# Patient Record
Sex: Male | Born: 1990 | Race: Black or African American | Hispanic: No | Marital: Single | Smoking: Former smoker
Health system: Southern US, Community
[De-identification: ages and names within clinical notes are randomized; demographics above are authoritative.]

## PROBLEM LIST (undated history)

## (undated) DIAGNOSIS — I10 Essential (primary) hypertension: Secondary | ICD-10-CM

## (undated) DIAGNOSIS — J45909 Unspecified asthma, uncomplicated: Secondary | ICD-10-CM

## (undated) HISTORY — PX: CARDIAC SURGERY: SHX584

---
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
## 2012-11-16 ENCOUNTER — Encounter (HOSPITAL_COMMUNITY): Payer: Self-pay | Admitting: Emergency Medicine

## 2012-11-16 ENCOUNTER — Emergency Department (HOSPITAL_COMMUNITY): Payer: BC Managed Care – PPO

## 2012-11-16 ENCOUNTER — Emergency Department (HOSPITAL_COMMUNITY)
Admission: EM | Admit: 2012-11-16 | Discharge: 2012-11-16 | Disposition: A | Discharge status: Home / Self Care | Payer: BC Managed Care – PPO | Attending: Emergency Medicine | Admitting: Emergency Medicine

## 2012-11-16 DIAGNOSIS — S1093XA Contusion of unspecified part of neck, initial encounter: Secondary | ICD-10-CM | POA: Insufficient documentation

## 2012-11-16 DIAGNOSIS — S0990XA Unspecified injury of head, initial encounter: Secondary | ICD-10-CM | POA: Insufficient documentation

## 2012-11-16 DIAGNOSIS — S0083XA Contusion of other part of head, initial encounter: Secondary | ICD-10-CM

## 2012-11-16 DIAGNOSIS — S0003XA Contusion of scalp, initial encounter: Secondary | ICD-10-CM | POA: Insufficient documentation

## 2012-11-16 DIAGNOSIS — Z23 Encounter for immunization: Secondary | ICD-10-CM | POA: Insufficient documentation

## 2012-11-16 MED ORDER — HYDROCODONE-ACETAMINOPHEN 5-325 MG PO TABS: 2.0000 | ORAL_TABLET | ORAL | Status: DC | PRN

## 2012-11-16 MED ORDER — NAPROXEN 500 MG PO TABS: 500.0000 mg | ORAL_TABLET | Freq: Two times a day (BID) | ORAL | Status: DC

## 2012-11-16 MED ORDER — OXYCODONE-ACETAMINOPHEN 5-325 MG PO TABS
2.0000 | ORAL_TABLET | Freq: Once | ORAL | Status: AC
  Administered 2012-11-16: 2 via ORAL
  Filled 2012-11-16: qty 2

## 2012-11-16 MED ORDER — TETANUS-DIPHTH-ACELL PERTUSSIS 5-2.5-18.5 LF-MCG/0.5 IM SUSP
0.5000 mL | Freq: Once | INTRAMUSCULAR | Status: AC
  Administered 2012-11-16: 0.5 mL via INTRAMUSCULAR
  Filled 2012-11-16: qty 0.5
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
## 2012-11-16 NOTE — ED Notes (Signed)
Pt transported to CT ?
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
## 2012-11-16 NOTE — ED Notes (Signed)
Pt has ride home.

## 2012-11-16 NOTE — ED Notes (Signed)
Family at bedside. 

## 2014-04-16 ENCOUNTER — Emergency Department (INDEPENDENT_AMBULATORY_CARE_PROVIDER_SITE_OTHER)
Admission: EM | Admit: 2014-04-16 | Discharge: 2014-04-16 | Disposition: A | Discharge status: Home / Self Care | Payer: Self-pay | Source: Home / Self Care | Attending: Family Medicine | Admitting: Family Medicine

## 2014-04-16 ENCOUNTER — Encounter (HOSPITAL_COMMUNITY): Payer: Self-pay | Admitting: Emergency Medicine

## 2014-04-16 ENCOUNTER — Other Ambulatory Visit (HOSPITAL_COMMUNITY)
Admission: RE | Admit: 2014-04-16 | Discharge: 2014-04-16 | Disposition: A | Discharge status: Home / Self Care | Payer: Self-pay | Source: Ambulatory Visit | Attending: Family Medicine | Admitting: Family Medicine

## 2014-04-16 DIAGNOSIS — A749 Chlamydial infection, unspecified: Secondary | ICD-10-CM

## 2014-04-16 DIAGNOSIS — Z113 Encounter for screening for infections with a predominantly sexual mode of transmission: Secondary | ICD-10-CM | POA: Insufficient documentation

## 2014-04-16 MED ORDER — AZITHROMYCIN 250 MG PO TABS
ORAL_TABLET | ORAL | Status: AC
  Filled 2014-04-16: qty 1

## 2014-04-16 MED ORDER — CEFTRIAXONE SODIUM 250 MG IJ SOLR
INTRAMUSCULAR | Status: AC
  Filled 2014-04-16: qty 250

## 2014-04-16 MED ORDER — LIDOCAINE HCL (PF) 1 % IJ SOLN
INTRAMUSCULAR | Status: AC
  Filled 2014-04-16: qty 5

## 2014-04-16 MED ORDER — AZITHROMYCIN 250 MG PO TABS
ORAL_TABLET | ORAL | Status: AC
  Filled 2014-04-16: qty 4

## 2014-04-16 MED ORDER — AZITHROMYCIN 250 MG PO TABS
1000.0000 mg | ORAL_TABLET | Freq: Once | ORAL | Status: AC
  Administered 2014-04-16: 1000 mg via ORAL

## 2014-04-16 MED ORDER — CEFTRIAXONE SODIUM 250 MG IJ SOLR
250.0000 mg | Freq: Once | INTRAMUSCULAR | Status: AC
  Administered 2014-04-16: 250 mg via INTRAMUSCULAR

## 2014-04-16 NOTE — ED Notes (Signed)
Reports his partner has been treated for Chlamydia He is asymptomatic Alert, no signs of acute distress.

## 2014-04-16 NOTE — Discharge Instructions (Signed)
Thank you for coming in today.  Safe Sex Safe sex is about reducing the risk of giving or getting a sexually transmitted disease (STD). STDs are spread through sexual contact involving the genitals, mouth, or rectum. Some STDs can be cured and others cannot. Safe sex can also prevent unintended pregnancies.  WHAT ARE SOME SAFE SEX PRACTICES?  Limit your sexual activity to only one partner who is having sex with only you.  Talk to your partner about his or her past partners, past STDs, and drug use.  Use a condom every time you have sexual intercourse. This includes vaginal, oral, and anal sexual activity. Both females and males should wear condoms during oral sex. Only use latex or polyurethane condoms and water-based lubricants. Using petroleum-based lubricants or oils to lubricate a condom will weaken the condom and increase the chance that it will break. The condom should be in place from the beginning to the end of sexual activity. Wearing a condom reduces, but does not completely eliminate, your risk of getting or giving an STD. STDs can be spread by contact with infected body fluids and skin.  Get vaccinated for hepatitis B and HPV.  Avoid alcohol and recreational drugs, which can affect your judgment. You may forget to use a condom or participate in high-risk sex.  For females, avoid douching after sexual intercourse. Douching can spread an infection farther into the reproductive tract.  Check your body for signs of sores, blisters, rashes, or unusual discharge. See your health care provider if you notice any of these signs.  Avoid sexual contact if you have symptoms of an infection or are being treated for an STD. If you or your partner has herpes, avoid sexual contact when blisters are present. Use condoms at all other times.  If you are at risk of being infected with HIV, it is recommended that you take a prescription medicine daily to prevent HIV infection. This is called  pre-exposure prophylaxis (PrEP). You are considered at risk if:  You are a man who has sex with other men (MSM).  You are a heterosexual man or woman who is sexually active with more than one partner.  You take drugs by injection.  You are sexually active with a partner who has HIV.  Talk with your health care provider about whether you are at high risk of being infected with HIV. If you choose to begin PrEP, you should first be tested for HIV. You should then be tested every 3 months for as long as you are taking PrEP.  See your health care provider for regular screenings, exams, and tests for other STDs. Before having sex with a new partner, each of you should be screened for STDs and should talk about the results with each other. WHAT ARE THE BENEFITS OF SAFE SEX?   There is less chance of getting or giving an STD.  You can prevent unwanted or unintended pregnancies.  By discussing safe sex concerns with your partner, you may increase feelings of intimacy, comfort, trust, and honesty between the two of you. Document Released: 06/09/2004 Document Revised: 09/16/2013 Document Reviewed: 10/24/2011 [facility] Patient Information 2015 [place], [place]. This information is not intended to replace advice given to you by your health care provider. Make sure you discuss any questions you have with your health care provider.

## 2014-04-16 NOTE — ED Provider Notes (Signed)
Raymond [name] is a 23 y.o. male who presents to Urgent Care today for Chlamydia. Patient's sexual partner tested positive for Chlamydia. He is here today for testing. He is asymptomatic. No fevers or chills nausea vomiting or diarrhea. He feels well otherwise.   History reviewed. No pertinent past medical history. History reviewed. No pertinent past surgical history. History  Substance Use Topics  . Smoking status: Never Smoker   . Smokeless tobacco: Not on file  . Alcohol Use: Yes   ROS as above Medications: Current Facility-Administered Medications  Medication Dose Route Frequency Provider Last Rate Last Dose  . azithromycin (ZITHROMAX) tablet 1,000 mg  1,000 mg Oral Once Rodolph BongEvan S Norberta Stobaugh, MD      . cefTRIAXone (ROCEPHIN) injection 250 mg  250 mg Intramuscular Once Rodolph BongEvan S Kassadi Presswood, MD       Current Outpatient Prescriptions  Medication Sig Dispense Refill  . HYDROcodone-acetaminophen (NORCO/VICODIN) 5-325 MG per tablet Take 2 tablets by mouth every 4 (four) hours as needed for pain. 10 tablet 0  . naproxen (NAPROSYN) 500 MG tablet Take 1 tablet (500 mg total) by mouth 2 (two) times daily with a meal. 30 tablet 0   No Known Allergies   Exam:  BP 148/81 mmHg  Pulse 77  Temp(Src) 97.7 F (36.5 C) (Oral)  Resp 16 Gen: Well NAD Genitals: No inguinal lymphadenopathy. Testicles are descended bilaterally and nontender with no masses. Penis is circumcised and normal appearing with no masses or discharge. No lesions.  Patient was given 1 g by mouth azithromycin 250 mg IM ceftriaxone prior to discharge.  No results found for this or any previous visit (from the past 24 hour(s)). No results found.  Assessment and Plan: 23 y.o. male with Chlamydia exposure. Treated as above. Urine cytology pending for gonorrhea Chlamydia and Trichomonas. Blood testing for HIV and syphilis also pending.  Discussed warning signs or symptoms. Please see discharge instructions. Patient expresses  understanding.     Rodolph BongEvan S Shantee Hayne, MD 04/16/14 [phone]

## 2014-04-16 NOTE — ED Notes (Signed)
Call back number verified.  

## 2014-04-17 LAB — HIV ANTIBODY (ROUTINE TESTING W REFLEX): HIV: NONREACTIVE

## 2014-04-17 LAB — URINE CYTOLOGY ANCILLARY ONLY
Chlamydia: NEGATIVE
NEISSERIA GONORRHEA: NEGATIVE
TRICH (WINDOWPATH): NEGATIVE

## 2014-04-17 LAB — RPR

## 2014-04-21 NOTE — ED Notes (Signed)
Patient called to discuss lab reports. After verifying ID, discussed negative reports. Advised to practice safer sex
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
## 2016-08-03 ENCOUNTER — Emergency Department (HOSPITAL_COMMUNITY)
Admission: EM | Admit: 2016-08-03 | Discharge: 2016-08-03 | Disposition: A | Discharge status: Home / Self Care | Payer: 59 | Attending: Emergency Medicine | Admitting: Emergency Medicine

## 2016-08-03 ENCOUNTER — Encounter (HOSPITAL_COMMUNITY): Payer: Self-pay | Admitting: Emergency Medicine

## 2016-08-03 DIAGNOSIS — Z79899 Other long term (current) drug therapy: Secondary | ICD-10-CM | POA: Diagnosis not present

## 2016-08-03 DIAGNOSIS — W450XXA Nail entering through skin, initial encounter: Secondary | ICD-10-CM | POA: Insufficient documentation

## 2016-08-03 DIAGNOSIS — Y990 Civilian activity done for income or pay: Secondary | ICD-10-CM | POA: Insufficient documentation

## 2016-08-03 DIAGNOSIS — Y939 Activity, unspecified: Secondary | ICD-10-CM | POA: Diagnosis not present

## 2016-08-03 DIAGNOSIS — S91342A Puncture wound with foreign body, left foot, initial encounter: Secondary | ICD-10-CM | POA: Diagnosis not present

## 2016-08-03 DIAGNOSIS — Y929 Unspecified place or not applicable: Secondary | ICD-10-CM | POA: Diagnosis not present

## 2016-08-03 DIAGNOSIS — Z23 Encounter for immunization: Secondary | ICD-10-CM | POA: Diagnosis not present

## 2016-08-03 DIAGNOSIS — S99922A Unspecified injury of left foot, initial encounter: Secondary | ICD-10-CM | POA: Diagnosis present

## 2016-08-03 DIAGNOSIS — T148XXA Other injury of unspecified body region, initial encounter: Secondary | ICD-10-CM

## 2016-08-03 MED ORDER — TETANUS-DIPHTH-ACELL PERTUSSIS 5-2.5-18.5 LF-MCG/0.5 IM SUSP
0.5000 mL | Freq: Once | INTRAMUSCULAR | Status: AC
  Administered 2016-08-03: 0.5 mL via INTRAMUSCULAR
  Filled 2016-08-03: qty 0.5

## 2016-08-03 MED ORDER — CIPROFLOXACIN HCL 750 MG PO TABS: 750.0000 mg | ORAL_TABLET | Freq: Two times a day (BID) | ORAL | 0 refills | Status: DC

## 2016-08-03 NOTE — Discharge Instructions (Signed)
Please read the attached information.  You have no signs of infection at this visit.  If signs of infection including nose discussed with redness, swelling, discharge please initiate antibiotic therapy and follow-up for repeat assessment.

## 2016-08-03 NOTE — ED Provider Notes (Signed)
[facility]-EMERGENCY DEPT Provider Note   CSN: [id number] Arrival date & time: 08/03/16  1749  By signing my name below, I, Raymond [name], attest that this documentation has been prepared under the direction and in the presence of Raymond RubbermaidJeffrey Lataria Courser, PA-C.  Electronically Signed: Rosario AdieWilliam Andrew [name], ED Scribe. 08/03/16. 7:21 PM.  History   Chief Complaint Chief Complaint  Patient presents with  . Foot Injury   The history is provided by the patient. No language interpreter was used.    HPI Comments: Raymond [name] is a 26 y.o. male with no pertinent PMHx, who presents to the Emergency Department complaining of sudden onset, persistent left lateral foot pain beginning yesterday. Per pt, he was at work yesterday wearing protective footwear when he stepped on a nail which penetrated through his boot and into the lateral plantar surface of his foot, sustaining a small puncture wound and pain to the area. No active bleeding at this time. His pain is exacerbated with ambulation and with weightbearing to the foot. No noted treatments for his symptoms were tried prior to coming into the ED. No h/o DM. He denies numbness, weakness, or any other associated symptoms. Tetanus is not UTD.   History reviewed. No pertinent past medical history.  There are no active problems to display for this patient.  Past Surgical History:  Procedure Laterality Date  . CARDIAC SURGERY     as a child    Home Medications    Prior to Admission medications   Medication Sig Start Date End Date Taking? Authorizing Provider  ciprofloxacin (CIPRO) 750 MG tablet Take 1 tablet (750 mg total) by mouth 2 (two) times daily. 08/03/16   Raymond MechanicJeffrey Demetrick Eichenberger, PA-C  HYDROcodone-acetaminophen (NORCO/VICODIN) 5-325 MG per tablet Take 2 tablets by mouth every 4 (four) hours as needed for pain. 11/16/12   Raymond HongBrian Miller, [name]  naproxen (NAPROSYN) 500 MG tablet Take 1 tablet (500 mg total) by mouth 2 (two) times daily with a meal. 11/16/12   Raymond HongBrian  Miller, [name]   Family History History reviewed. No pertinent family history.  Social History Social History  Substance Use Topics  . Smoking status: Never Smoker  . Smokeless tobacco: Not on file  . Alcohol use Yes   Allergies   Shrimp [shellfish allergy]  Review of Systems Review of Systems  Musculoskeletal: Positive for myalgias.  Skin: Positive for wound.  Neurological: Negative for weakness and numbness.   Physical Exam Updated Vital Signs BP (!) 152/115 (BP Location: Left Arm)   Pulse 67   Temp 98.4 F (36.9 C) (Oral)   Resp 18   SpO2 100%   Physical Exam  Constitutional: He appears well-developed and well-nourished. No distress.  HENT:  Head: Normocephalic and atraumatic.  Eyes: Conjunctivae are normal.  Neck: Normal range of motion.  Cardiovascular: Normal rate.   Pulmonary/Chest: Effort normal.  Abdominal: He exhibits no distension.  Musculoskeletal: Normal range of motion.  Neurological: He is alert.  Skin: Skin is warm and dry. Capillary refill takes less than 2 seconds. No erythema. No pallor.  Small puncture mark of the lateral aspect of the left foot, no surrounding swelling edema, redness or warmth to touch  Psychiatric: He has a normal mood and affect. His behavior is normal.  Nursing note and vitals reviewed.  ED Treatments / Results  DIAGNOSTIC STUDIES: Oxygen Saturation is 100% on RA, normal by my interpretation.   COORDINATION OF CARE: 7:20 PM-Discussed next steps with pt. Pt verbalized understanding and is agreeable with the  plan.   Procedures Procedures   Medications Ordered in ED Medications  Tdap (BOOSTRIX) injection 0.5 mL (0.5 mLs Intramuscular Given 08/03/16 1934)    Initial Impression / Assessment and Plan / ED Course  I have reviewed the triage vital signs and the nursing notes.  Pertinent labs & imaging results that were available during my care of the patient were reviewed by me and considered in my medical decision making  (see chart for details).     26 year old male presents today with a puncture wound.  He has no signs of infectious etiology on my exam today patient reports that the entire nail was removed, low suspicion for embedded foreign body.  Patient will have tetanus updated.  I discussed that he did not have signs of infection and I do not recommend antibiotics.  Patient will be given Cipro in the event he develops an infection in his foot, he is encouraged to initiate antibiotic therapy and follow-up immediately for repeat evaluation.  He verbalized understanding and agreement to today's plan had no further questions or concerns at time of discharge  Final Clinical Impressions(s) / ED Diagnoses   Final diagnoses:  Puncture wound   New Prescriptions Discharge Medication List as of 08/03/2016  7:33 PM    START taking these medications   Details  ciprofloxacin (CIPRO) 750 MG tablet Take 1 tablet (750 mg total) by mouth 2 (two) times daily., Starting Wed 08/03/2016, Print       I personally performed the services described in this documentation, which was scribed in my presence. The recorded information has been reviewed and is accurate.    Raymond Mechanic, PA-C 08/03/16 2000    Raymond [name] 08/04/16 Raymond [name]

## 2016-08-03 NOTE — ED Notes (Signed)
Patient was alert, oriented and stable upon discharge. RN went over AVS and patient had no further questions.  

## 2016-08-03 NOTE — ED Triage Notes (Signed)
Pt states that he stepped on a nail that went into his L foot yesterday. Still has pain. Reports TDAP this year. Alert and oriented.

## 2016-10-17 ENCOUNTER — Encounter (HOSPITAL_COMMUNITY): Payer: Self-pay | Admitting: Emergency Medicine

## 2016-10-17 ENCOUNTER — Emergency Department (HOSPITAL_COMMUNITY)
Admission: EM | Admit: 2016-10-17 | Discharge: 2016-10-17 | Disposition: A | Discharge status: Home / Self Care | Payer: 59 | Attending: Emergency Medicine | Admitting: Emergency Medicine

## 2016-10-17 DIAGNOSIS — R509 Fever, unspecified: Secondary | ICD-10-CM | POA: Diagnosis present

## 2016-10-17 DIAGNOSIS — Z79899 Other long term (current) drug therapy: Secondary | ICD-10-CM | POA: Diagnosis not present

## 2016-10-17 DIAGNOSIS — J029 Acute pharyngitis, unspecified: Secondary | ICD-10-CM

## 2016-10-17 DIAGNOSIS — Z87891 Personal history of nicotine dependence: Secondary | ICD-10-CM | POA: Diagnosis not present

## 2016-10-17 LAB — RAPID STREP SCREEN (MED CTR MEBANE ONLY): Streptococcus, Group A Screen (Direct): NEGATIVE

## 2016-10-17 MED ORDER — KETOROLAC TROMETHAMINE 60 MG/2ML IM SOLN
60.0000 mg | Freq: Once | INTRAMUSCULAR | Status: AC
  Administered 2016-10-17: 60 mg via INTRAMUSCULAR
  Filled 2016-10-17: qty 2

## 2016-10-17 MED ORDER — ACETAMINOPHEN 325 MG PO TABS
650.0000 mg | ORAL_TABLET | Freq: Once | ORAL | Status: AC | PRN
  Administered 2016-10-17: 650 mg via ORAL
  Filled 2016-10-17: qty 2

## 2016-10-17 MED ORDER — HYDROCOD POLST-CPM POLST ER 10-8 MG/5ML PO SUER: 5.0000 mL | Freq: Two times a day (BID) | ORAL | 0 refills | Status: DC | PRN

## 2016-10-17 MED ORDER — DEXAMETHASONE SODIUM PHOSPHATE 10 MG/ML IJ SOLN
10.0000 mg | Freq: Once | INTRAMUSCULAR | Status: AC
  Administered 2016-10-17: 10 mg via INTRAMUSCULAR
  Filled 2016-10-17: qty 1

## 2016-10-17 NOTE — ED Triage Notes (Signed)
Pt states he has been sick for 1 week  Pt is c/o body aches, headache, sore throat, chills, decreased oral intake, diarrhea, and fever

## 2016-10-17 NOTE — ED Provider Notes (Signed)
[facility]-EMERGENCY DEPT Provider Note   CSN: [id number] Arrival date & time: 10/17/16  1918     History   Chief Complaint Chief Complaint  Patient presents with  . Fever  . Sore Throat    HPI Raymond [name] is a 26 y.o. male.  26 year old male presents with sore throat times several days. Fever and chills at home and has been self-medicating with Benadryl. Denies any vomiting or diarrhea. Slight cough. No headache or neck pain. No rashes noted. Does note sick exposures.      History reviewed. No pertinent past medical history.  There are no active problems to display for this patient.   Past Surgical History:  Procedure Laterality Date  . CARDIAC SURGERY     as a child       Home Medications    Prior to Admission medications   Medication Sig Start Date End Date Taking? Authorizing Provider  diphenhydrAMINE (BENADRYL) 25 MG tablet Take 50 mg by mouth every 6 (six) hours as needed for allergies or sleep.   Yes [provider]  ciprofloxacin (CIPRO) 750 MG tablet Take 1 tablet (750 mg total) by mouth 2 (two) times daily. Patient not taking: Reported on 10/17/2016 08/03/16   Eyvonne Mechanic, PA-C    Family History Family History  Problem Relation Age of Onset  . Hypertension Mother   . Hypertension Father     Social History Social History  Substance Use Topics  . Smoking status: Former Games developer  . Smokeless tobacco: Never Used  . Alcohol use No     Allergies   Shrimp [shellfish allergy]   Review of Systems Review of Systems  All other systems reviewed and are negative.    Physical Exam Updated Vital Signs BP (!) 150/95 (BP Location: Left Arm)   Pulse 88   Temp (!) 102.9 F (39.4 C) (Oral)   Resp 16   Ht 1.829 m (6')   Wt 116.2 kg (256 lb 1.6 oz)   SpO2 99%   BMI 34.73 kg/m   Physical Exam  Constitutional: He is oriented to person, place, and time. He appears well-developed and well-nourished.  Non-toxic appearance. No distress.    HENT:  Head: Normocephalic and atraumatic.  Mouth/Throat: Posterior oropharyngeal edema and posterior oropharyngeal erythema present. No tonsillar abscesses.  Eyes: Conjunctivae, EOM and lids are normal. Pupils are equal, round, and reactive to light.  Neck: Normal range of motion. Neck supple. No tracheal deviation present. No thyroid mass present.  Cardiovascular: Normal rate, regular rhythm and normal heart sounds.  Exam reveals no gallop.   No murmur heard. Pulmonary/Chest: Effort normal and breath sounds normal. No stridor. No respiratory distress. He has no decreased breath sounds. He has no wheezes. He has no rhonchi. He has no rales.  Abdominal: Soft. Normal appearance and bowel sounds are normal. He exhibits no distension. There is no tenderness. There is no rebound and no CVA tenderness.  Musculoskeletal: Normal range of motion. He exhibits no edema or tenderness.  Neurological: He is alert and oriented to person, place, and time. He has normal strength. No cranial nerve deficit or sensory deficit. GCS eye subscore is 4. GCS verbal subscore is 5. GCS motor subscore is 6.  Skin: Skin is warm and dry. No abrasion and no rash noted.  Psychiatric: He has a normal mood and affect. His speech is normal and behavior is normal.  Nursing note and vitals reviewed.    ED Treatments / Results  Labs (all labs ordered are  listed, but only abnormal results are displayed) Labs Reviewed  RAPID STREP [facility]CREEN ([facility])  CULTURE, GROUP A STREP [facility])    EKG  EKG Interpretation None       Radiology No results found.  Procedures Procedures (including critical care time)  Medications Ordered in ED Medications  dexamethasone (DECADRON) injection 10 mg (not administered)  ketorolac (TORADOL) injection 60 mg (not administered)  acetaminophen (TYLENOL) tablet 650 mg (650 mg Oral Given 10/17/16 2054)     Initial Impression / Assessment and Plan / ED Course  I have reviewed the triage  vital signs and the nursing notes.  Pertinent labs & imaging results that were available during my care of the patient were reviewed by me and considered in my medical decision making (see chart for details).   rapid strep is negative. Patient will be given Decadron as well as Toradol for his tonsillar edema. He has no signs of airway obstruction. Suspect viral illness. Return precautions given  Final Clinical Impressions(s) / ED Diagnoses   Final diagnoses:  None    New Prescriptions New Prescriptions   No medications on file     Lorre NickAllen, Miral Hoopes, MD 10/17/16 2240

## 2016-10-20 LAB — CULTURE, GROUP A STREP (THRC)

## 2017-06-12 ENCOUNTER — Encounter (HOSPITAL_COMMUNITY): Payer: Self-pay | Admitting: Emergency Medicine

## 2017-06-12 ENCOUNTER — Emergency Department (HOSPITAL_COMMUNITY): Payer: Self-pay

## 2017-06-12 ENCOUNTER — Emergency Department (HOSPITAL_COMMUNITY)
Admission: EM | Admit: 2017-06-12 | Discharge: 2017-06-12 | Disposition: A | Discharge status: Home / Self Care | Payer: Self-pay | Attending: Emergency Medicine | Admitting: Emergency Medicine

## 2017-06-12 DIAGNOSIS — J3489 Other specified disorders of nose and nasal sinuses: Secondary | ICD-10-CM | POA: Insufficient documentation

## 2017-06-12 DIAGNOSIS — R05 Cough: Secondary | ICD-10-CM | POA: Insufficient documentation

## 2017-06-12 DIAGNOSIS — R6883 Chills (without fever): Secondary | ICD-10-CM | POA: Insufficient documentation

## 2017-06-12 DIAGNOSIS — Z5321 Procedure and treatment not carried out due to patient leaving prior to being seen by health care provider: Secondary | ICD-10-CM | POA: Insufficient documentation

## 2017-06-12 DIAGNOSIS — R0602 Shortness of breath: Secondary | ICD-10-CM | POA: Insufficient documentation

## 2017-06-12 HISTORY — DX: Essential (primary) hypertension: I10

## 2017-06-12 HISTORY — DX: Unspecified asthma, uncomplicated: J45.909

## 2017-06-12 LAB — BASIC METABOLIC PANEL
ANION GAP: 13 (ref 5–15)
BUN: 8 mg/dL (ref 6–20)
CO2: 20 mmol/L — ABNORMAL LOW (ref 22–32)
Calcium: 9.7 mg/dL (ref 8.9–10.3)
Chloride: 102 mmol/L (ref 101–111)
Creatinine, Ser: 1.26 mg/dL — ABNORMAL HIGH (ref 0.61–1.24)
GFR calc Af Amer: >60 mL/min (ref >60)
GLUCOSE: 96 mg/dL (ref 65–99)
Potassium: 4.1 mmol/L (ref 3.5–5.1)
Sodium: 135 mmol/L (ref 135–145)

## 2017-06-12 LAB — I-STAT TROPONIN, ED: Troponin i, poc: 0 ng/mL (ref 0.00–0.08)

## 2017-06-12 LAB — CBC
HEMATOCRIT: 46.9 % (ref 39.0–52.0)
HEMOGLOBIN: 16.2 g/dL (ref 13.0–17.0)
MCH: 30.8 pg (ref 26.0–34.0)
MCHC: 34.5 g/dL (ref 30.0–36.0)
MCV: 89.2 fL (ref 78.0–100.0)
PLATELETS: 240 10*3/uL (ref 150–400)
RBC: 5.26 MIL/uL (ref 4.22–5.81)
RDW: 12 % (ref 11.5–15.5)
WBC: 8.2 10*3/uL (ref 4.0–10.5)

## 2017-06-12 MED ORDER — ALBUTEROL SULFATE (2.5 MG/3ML) 0.083% IN NEBU
5.0000 mg | INHALATION_SOLUTION | Freq: Once | RESPIRATORY_TRACT | Status: AC
  Administered 2017-06-12: 5 mg via RESPIRATORY_TRACT
  Filled 2017-06-12: qty 6

## 2017-06-12 NOTE — ED Notes (Signed)
Pt states he is going to leave due to wait.  Encouraged pt to stay and he declined.

## 2017-06-12 NOTE — ED Triage Notes (Signed)
Reports having head and chest congestion with cough, watery eyes, and chills.  Notably sob in triage.  Hx of asthma as a child.

## 2017-06-12 NOTE — ED Notes (Signed)
Pt remains in waiting room. Updated on wait for treatment room. 

## 2017-06-16 NOTE — ED Notes (Signed)
Called for follow-up call. No answer, unable to leave a message.

## 2019-09-14 IMAGING — CR DG CHEST 2V
2 series · 2 of 2 positions shown · non-contrast
Comparison: 07/06/2003

CLINICAL DATA: Head and chest congestion, cough, watery eyes, and
chills. Shortness of breath.

EXAM:
CHEST  2 VIEW

[chest pa]
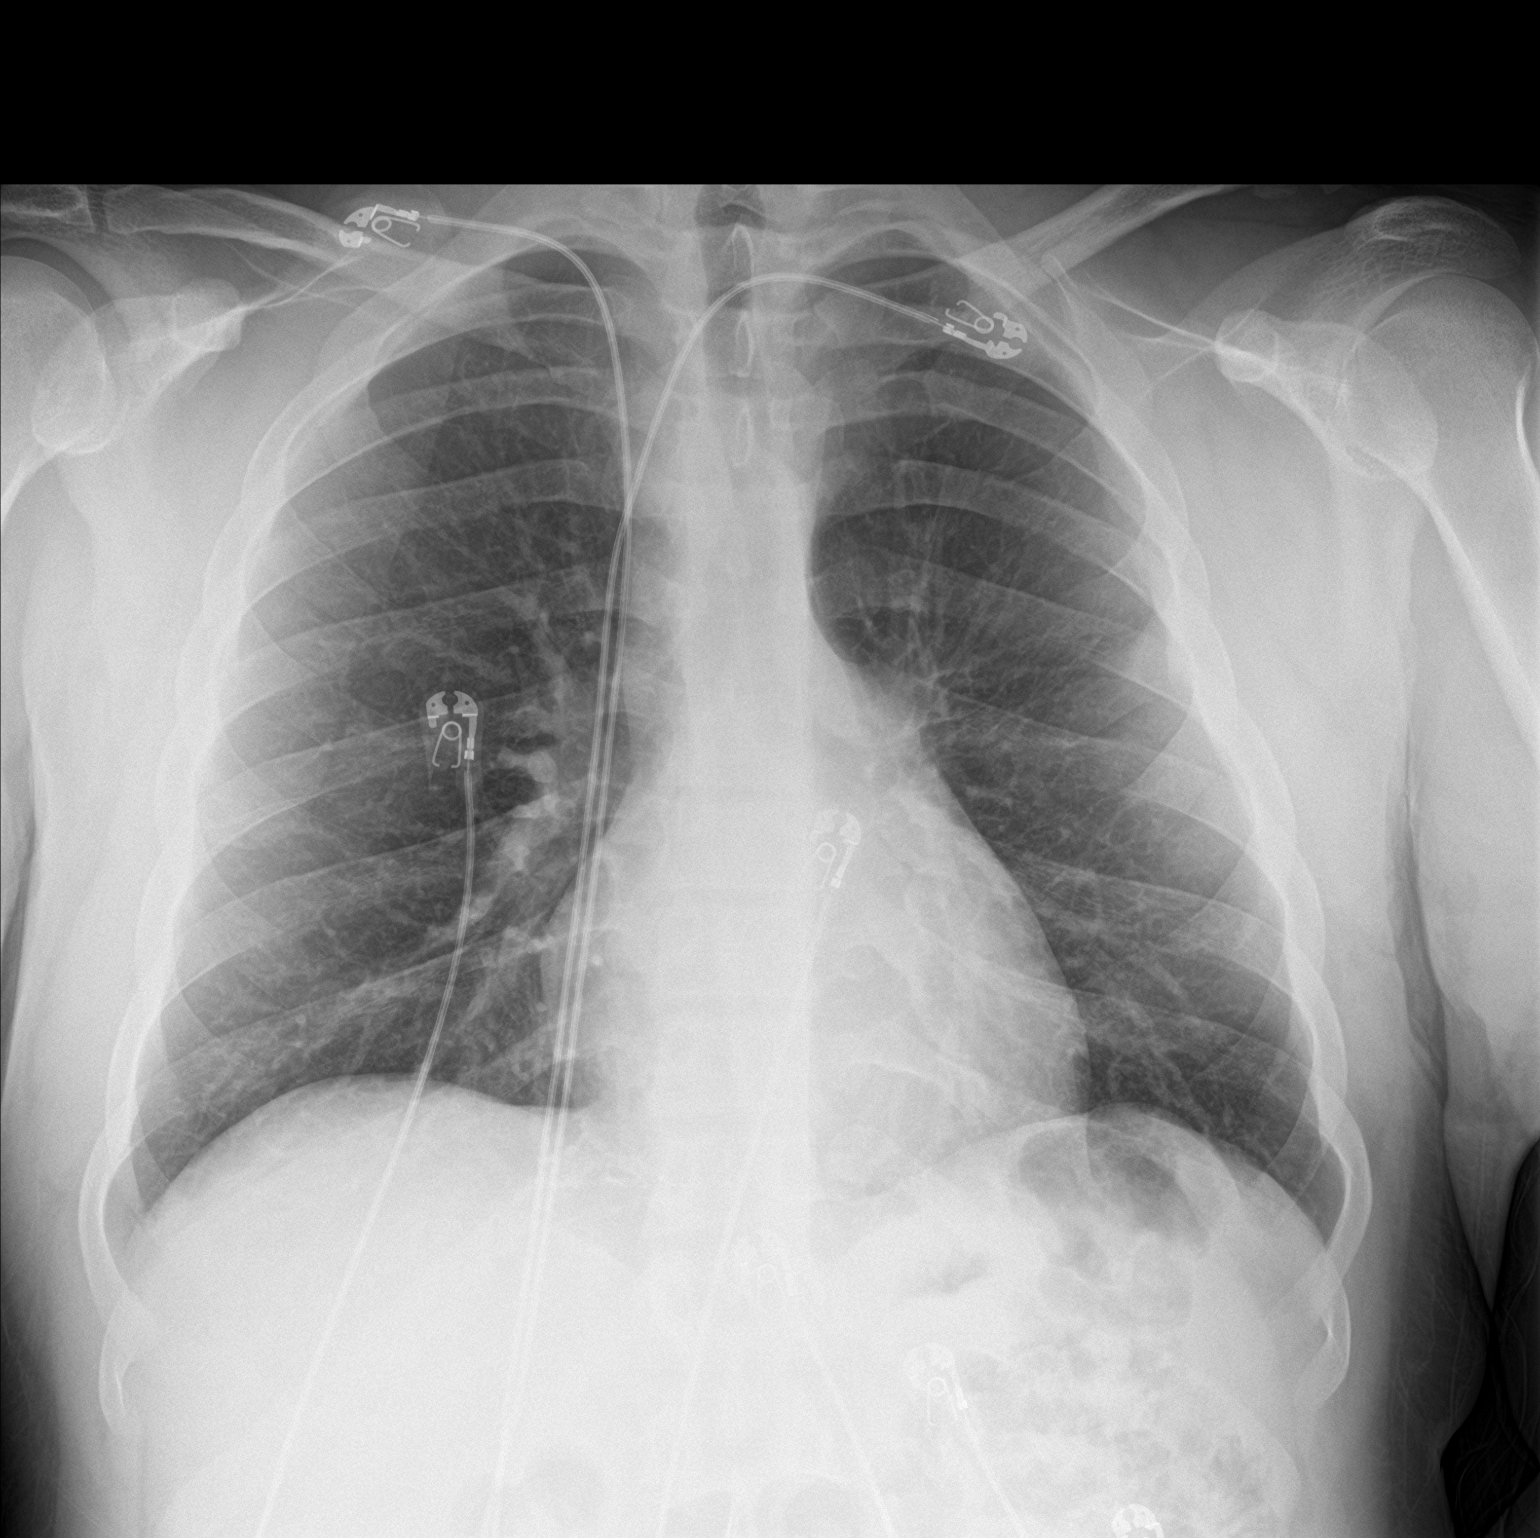

[chest lat]
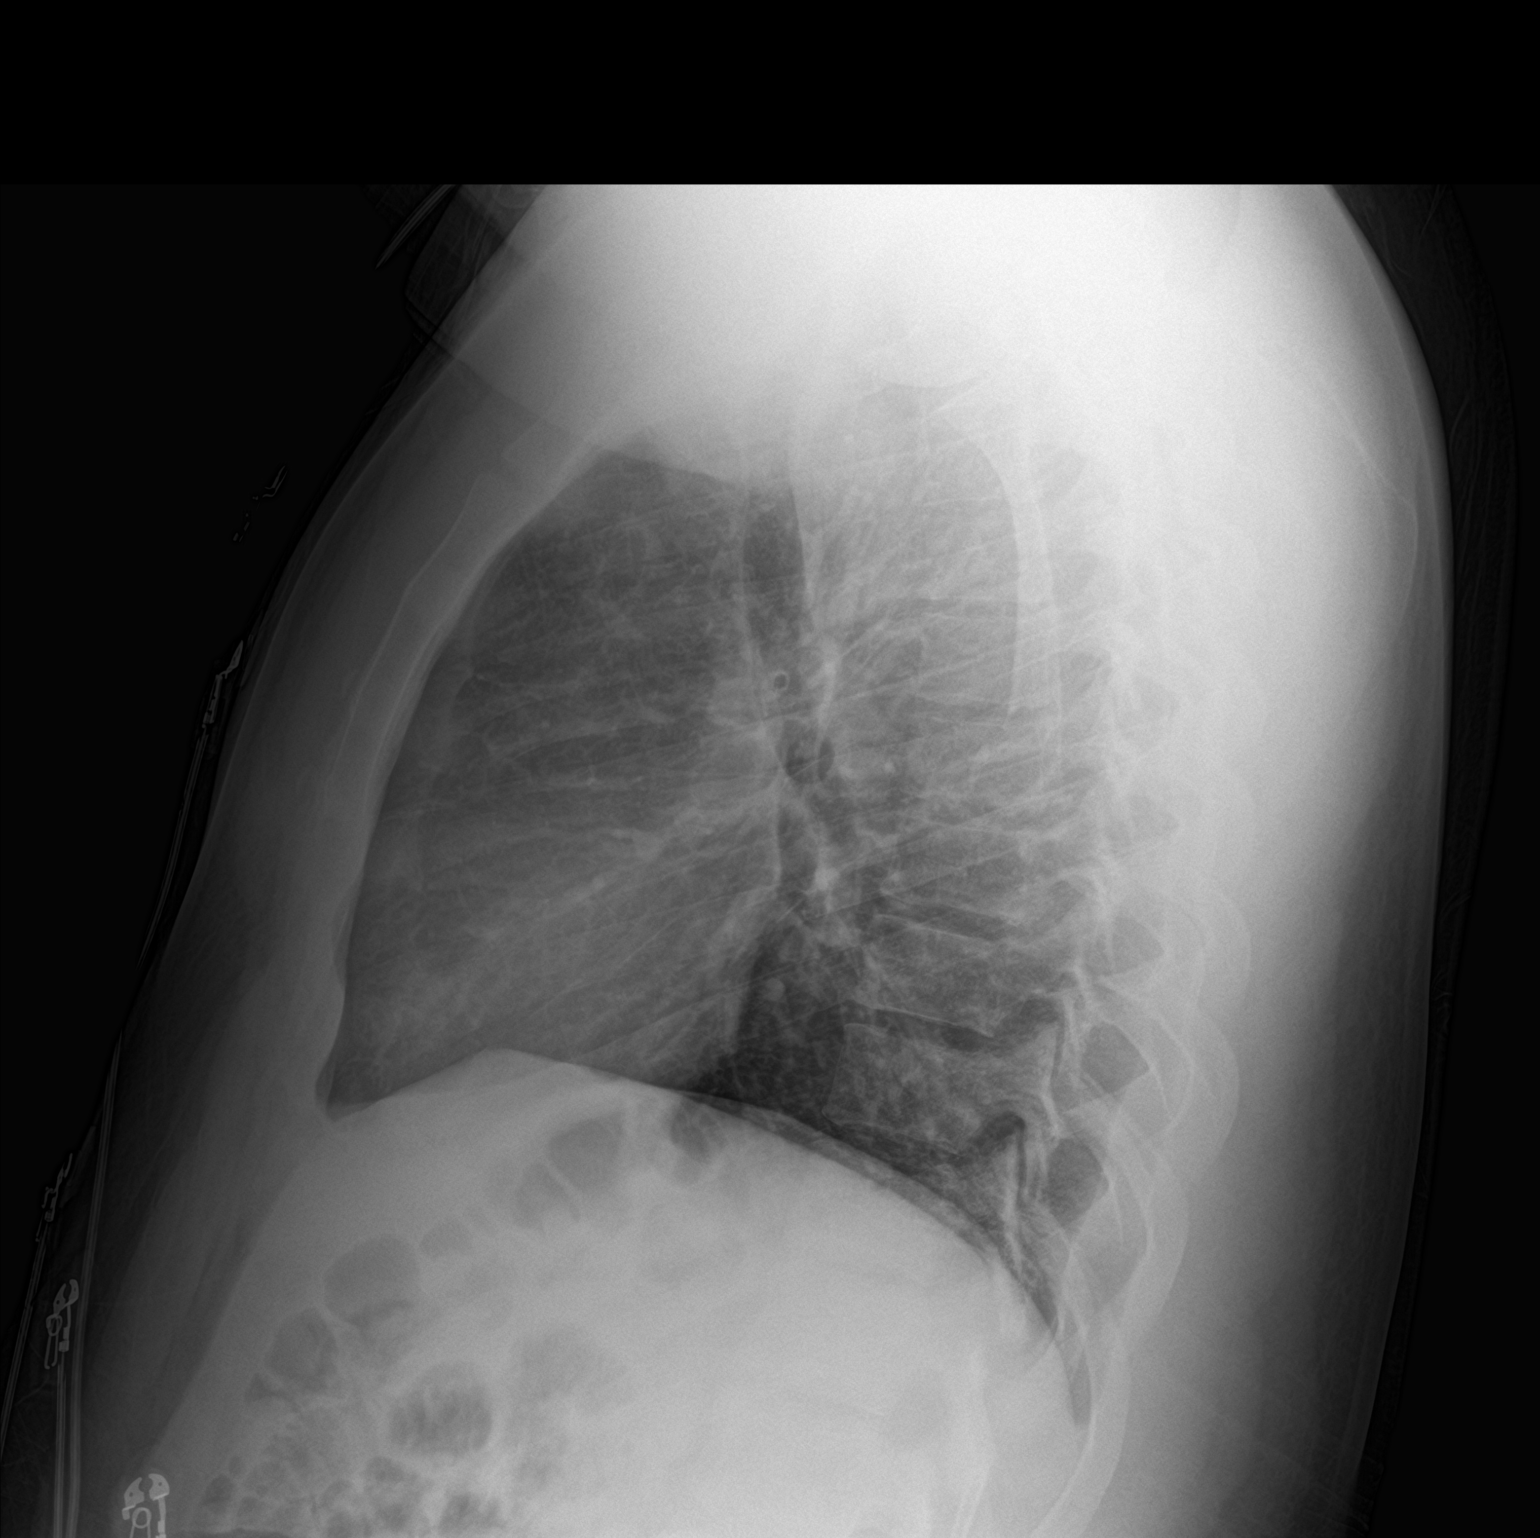

[2 of 2 positions shown; findings below may reference images not displayed]

FINDINGS: Normal heart size and pulmonary vascularity. No focal airspace
disease or consolidation in the lungs. No blunting of costophrenic
angles. No pneumothorax. Mediastinal contours appear intact. Old
left rib fractures. Grade 3 dislocation of the left
acromioclavicular joint.
IMPRESSION: 1. No evidence of active pulmonary disease.
2. Grade 3 dislocation of the left acromioclavicular joint.

## 2022-09-19 DIAGNOSIS — S39012A Strain of muscle, fascia and tendon of lower back, initial encounter: Secondary | ICD-10-CM | POA: Diagnosis not present

## 2022-09-19 DIAGNOSIS — X501XXA Overexertion from prolonged static or awkward postures, initial encounter: Secondary | ICD-10-CM | POA: Diagnosis not present

## 2022-09-21 ENCOUNTER — Ambulatory Visit (INDEPENDENT_AMBULATORY_CARE_PROVIDER_SITE_OTHER): Payer: BC Managed Care – PPO | Admitting: Medical

## 2022-09-21 VITALS — BP 110/66 | HR 78 | Wt 312.2 lb

## 2022-09-21 DIAGNOSIS — M5442 Lumbago with sciatica, left side: Secondary | ICD-10-CM | POA: Diagnosis not present

## 2022-09-21 DIAGNOSIS — M62838 Other muscle spasm: Secondary | ICD-10-CM

## 2022-09-21 DIAGNOSIS — T148XXA Other injury of unspecified body region, initial encounter: Secondary | ICD-10-CM

## 2022-09-21 DIAGNOSIS — M5432 Sciatica, left side: Secondary | ICD-10-CM

## 2022-09-21 MED ORDER — CYCLOBENZAPRINE HCL 10 MG PO TABS: 10.0000 mg | ORAL_TABLET | Freq: Three times a day (TID) | ORAL | 0 refills | Status: DC | PRN

## 2022-09-21 MED ORDER — MELOXICAM 15 MG PO TABS: 15.0000 mg | ORAL_TABLET | Freq: Every day | ORAL | 0 refills | Status: DC

## 2022-09-21 NOTE — Patient Instructions (Addendum)
Your symptoms and manage suggest muscle strain, spasm and sciatica   Recommendations: Begin meloxicam 15 mg daily for the next week.  After 1 week you can just uses as needed for pain and inflammation Begin Flexeril cyclobenzaprine muscle relaxer for the next few days.  You can take either 1/2 tablet or 1 whole tablet up to 3 times a day for muscle spasm and strain.  Caution as this medicine can make you feel sleepy Do not drive or operate machinery while taking Flexeril muscle laxer.  The medicine stays in the system at least 3 to 4 hours at a time Use stretching and relative rest over the next few days You can use heat such as hot bath or hot shower or heating pad on the back for the next few days You should gradually improve over the next several days.  By next Monday you will probably feel much improved If not improving at all or if worse over the next week then recheck as you might would need imaging at that point if not improving   Sciatica  Sciatica is pain, weakness, tingling, or loss of feeling (numbness) along the sciatic nerve. The sciatic nerve starts in the lower back and goes down the back of each leg. Sciatica usually affects one side of the body. Sciatica usually goes away on its own or with treatment. Sometimes, sciatica may come back. What are the causes? This condition happens when the sciatic nerve is pinched or has pressure put on it. This may be caused by: A disk in between the bones of the spine bulging out too far (herniated disk). Changes in the spinal disks due to aging. A condition that affects a muscle in the butt. Extra bone growth near the sciatic nerve. A break (fracture) of the area between your hip bones (pelvis). Pregnancy. Tumor. This is rare. What increases the risk? You are more likely to develop this condition if you: Play sports that put pressure or stress on the spine. Have poor strength and ease of movement (flexibility). Have had a back injury  or back surgery. Sit for long periods of time. Do activities that involve bending or lifting over and over again. Are very overweight (obese). What are the signs or symptoms? Symptoms can vary from mild to very bad. They may include: Any of these problems in the lower back, leg, hip, or butt: Mild tingling, loss of feeling, or dull aches. A burning feeling. Sharp pains. Loss of feeling in the back of the calf or the sole of the foot. Leg weakness. Very bad back pain that makes it hard to move. These symptoms may get worse when you cough, sneeze, or laugh. They may also get worse when you sit or stand for long periods of time. How is this treated? This condition often gets better without any treatment. However, treatment may include: Changing or cutting back on physical activity when you have pain. Exercising, including strengthening and stretching. Putting ice or heat on the affected area. Shots of medicines to relieve pain and swelling or to relax your muscles. Surgery. Follow these instructions at home: Medicines Take over-the-counter and prescription medicines only as told by your doctor. Ask your doctor if you should avoid driving or using machines while you are taking your medicine. Managing pain     If told, put ice on the affected area. To do this: Put ice in a plastic bag. Place a towel between your skin and the bag. Leave the ice on for  20 minutes, 2-3 times a day. If your skin turns bright red, take off the ice right away to prevent skin damage. The risk of skin damage is higher if you cannot feel pain, heat, or cold. If told, put heat on the affected area. Do this as often as told by your doctor. Use the heat source that your doctor tells you to use, such as a moist heat pack or a heating pad. Place a towel between your skin and the heat source. Leave the heat on for 20-30 minutes. If your skin turns bright red, take off the heat right away to prevent burns. The risk  of burns is higher if you cannot feel pain, heat, or cold. Activity  Return to your normal activities when your doctor says that it is safe. Avoid activities that make your symptoms worse. Take short rests during the day. When you rest for a long time, do some physical activity or stretching between periods of rest. Avoid sitting for a long time without moving. Get up and move around at least one time each hour. Do exercises and stretches as told by your doctor. Do not lift anything that is heavier than 10 lb (4.5 kg). Avoid lifting heavy things even when you do not have symptoms. Avoid lifting heavy things over and over. When you lift objects, always lift in a way that is safe for your body. To do this, you should: Bend your knees. Keep the object close to your body. Avoid twisting. General instructions Stay at a healthy weight. Wear comfortable shoes that support your feet. Avoid wearing high heels. Avoid sleeping on a mattress that is too soft or too hard. You might have less pain if you sleep on a mattress that is firm enough to support your back. Contact a doctor if: Your pain is not controlled by medicine. Your pain does not get better. Your pain gets worse. Your pain lasts longer than 4 weeks. You lose weight without trying. Get help right away if: You cannot control when you pee (urinate) or poop (have a bowel movement). You have weakness in any of these areas and it gets worse: Lower back. The area between your hip bones. Butt. Legs. You have redness or swelling of your back. You have a burning feeling when you pee. Summary Sciatica is pain, weakness, tingling, or loss of feeling (numbness) along the sciatic nerve. This may include the lower back, legs, hips, and butt. This condition happens when the sciatic nerve is pinched or has pressure put on it. Treatment often includes rest, exercise, medicines, and putting ice or heat on the affected area. This information is  not intended to replace advice given to you by your health care provider. Make sure you discuss any questions you have with your health care provider. Document Revised: 08/09/2021 Document Reviewed: 08/09/2021 Elsevier Patient Education  2023 [employer].

## 2022-09-21 NOTE — Progress Notes (Signed)
Subjective:  Raymond [name] is a 32 y.o. male who presents for Chief Complaint  Patient presents with   get established    Get established. Having back pain- locking up, having trouble walking and sitting and bending makes it worse. Taking otc epsolt salt, icy hot, ibuprofen. Went to UC yesterday and had a shot in back and still in a lot of pain. Haven't been able to work all week due to severe pain     Here as a new patient to establish care.   No prior recent primary care.  He went to urgent care through [facility] 2 days ago for back pain.  This past weekend he was out of town in [place] on vacation.  He was trying to rest.  He had pain and came out of the blue, locking up in his back, tenderness in the back, low back pain on the left radiating down to his buttock and leg.  He did see an urgent care.  They gave him a shot of pain medicine.  Although they sent 2 medicines to the pharmacy, he says that the medications were not available at the pharmacy and he is not sure why.  So his only treatment so far was the Toradol shot at urgent care.  Today he still has about the same pain maybe a little bit less.  Still having low left back pain radiating to the left leg.  Hurts to bend and turn and move.  The day he went to urgent care he could not walk straight, had to bend over.  It was worse at night.  He also had worsening of the pain driving home from [place]  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  He is not able to do that work he thinks the next few days.  He was given a work note at the urgent care going back tomorrow but he certainly does not feel ready to go back tomorrow.  He denies any other significant health issues in general. No abdominal pain.  No urinary complaints.  No fever, no blood in the stool or urine.  No numbness down the legs.  No weakness in the legs.  Unrelated, he noticed a history of knots in his  abdomen.  Some other members of his family has had similar.  They have been there a long time unchanged.  No other aggravating or relieving factors.    No other c/o.  Past Medical History:  Diagnosis Date   Asthma    Hypertension    Current Outpatient Medications on File Prior to Visit  Medication Sig Dispense Refill   IBUPROFEN PO Take by mouth.     No current facility-administered medications on file prior to visit.    The following portions of the patient's history were reviewed and updated as appropriate: allergies, current medications, past family history, past medical history, past social history, past surgical history and problem list.  ROS Otherwise as in subjective above  Objective: BP 110/66   Pulse 78   Wt (!) 312 lb 3.2 oz (141.6 kg)   BMI 42.34 kg/m   General appearance: alert, no distress, well developed, well nourished, muscular build, African-American male Numerous tattoos both arms Tender left lumbar paraspinal region and positive spasm, range of motion is limited in general due to pain, no scoliosis or deformity Legs nontender with relatively normal range of motion Abdomen: +bs, soft, non tender, non distended,  no masses, no hepatomegaly, no splenomegaly Pulses: 2+ radial pulses, 2+ pedal pulses, normal cap refill Ext: no edema Legs neurovascularly intact    Assessment: Encounter Diagnoses  Name Primary?   Acute left-sided low back pain with left-sided sciatica Yes   Muscle spasm    Muscle strain    Sciatica of left side      Plan: We discussed symptoms and exam findings.  I reviewed his urgent care notes as well.  Symptoms and exam suggest back pain, strain and sciatica.  We discussed diagnosis of sciatica, usual timeframe to see improvement.   Recommendations: Begin meloxicam 15 mg daily for the next week.  After 1 week you can just uses as needed for pain and inflammation Begin Flexeril cyclobenzaprine muscle relaxer for the next few days.   You can take either 1/2 tablet or 1 whole tablet up to 3 times a day for muscle spasm and strain.  Caution as this medicine can make you feel sleepy Do not drive or operate machinery while taking Flexeril muscle laxer.  The medicine stays in the system at least 3 to 4 hours at a time Use stretching and relative rest over the next few days You can use heat such as hot bath or hot shower or heating pad on the back for the next few days You should gradually improve over the next several days.  By next Monday you will probably feel much improved If not improving at all or if worse over the next week then recheck as you might would need imaging at that point if not improving  Note given for work.  Gerone was seen today for get established.  Diagnoses and all orders for this visit:  Acute left-sided low back pain with left-sided sciatica  Muscle spasm  Muscle strain  Sciatica of left side  Other orders -     cyclobenzaprine (FLEXERIL) 10 MG tablet; Take 1 tablet (10 mg total) by mouth 3 (three) times daily as needed for muscle spasms. -     meloxicam (MOBIC) 15 MG tablet; Take 1 tablet (15 mg total) by mouth daily.    Follow up: prn

## 2022-09-22 ENCOUNTER — Other Ambulatory Visit: Payer: Self-pay | Admitting: Medical

## 2022-09-22 MED ORDER — HYDROCODONE-ACETAMINOPHEN 7.5-325 MG PO TABS: 1.0000 | ORAL_TABLET | Freq: Four times a day (QID) | ORAL | 0 refills | Status: AC | PRN

## 2022-09-27 ENCOUNTER — Encounter: Payer: Self-pay | Admitting: Medical

## 2022-09-27 ENCOUNTER — Ambulatory Visit (INDEPENDENT_AMBULATORY_CARE_PROVIDER_SITE_OTHER): Payer: BC Managed Care – PPO | Admitting: Medical

## 2022-09-27 VITALS — BP 120/90 | HR 84 | Wt 312.2 lb

## 2022-09-27 DIAGNOSIS — M62838 Other muscle spasm: Secondary | ICD-10-CM

## 2022-09-27 DIAGNOSIS — M5442 Lumbago with sciatica, left side: Secondary | ICD-10-CM | POA: Diagnosis not present

## 2022-09-27 DIAGNOSIS — M25552 Pain in left hip: Secondary | ICD-10-CM

## 2022-09-27 NOTE — Progress Notes (Signed)
Referred to breakthrough PT

## 2022-09-27 NOTE — Patient Instructions (Addendum)
Recommendations:  referral today for xrays and physical therapy.  Expect a phone call for physical therapy within the next 2-3 days  I have written you out of work today through 10/02/2022.   I have you going back to work 10/03/22 light duty through 10/13/22.  I will need to see you back 10/13/22 for follow up.  Continue Meloxicam daily for the next 1-2 weeks  You can continue flexeril muscle relaxer 1-2 times daily as needed for spasm in low back  You can still use the Hydrocodone pain medication for worse breakthrough pain  Stretch daily    Please go to [facility] for your back and left hip xrays.   Their hours are 8am - 4:30 pm Monday - Friday.  Take your insurance card with you.  [facility] [phone]   [address] [place], [place] [address]     Sciatica  Sciatica is pain, weakness, tingling, or loss of feeling (numbness) along the sciatic nerve. The sciatic nerve starts in the lower back and goes down the back of each leg. Sciatica usually affects one side of the body. Sciatica usually goes away on its own or with treatment. Sometimes, sciatica may come back. What are the causes? This condition happens when the sciatic nerve is pinched or has pressure put on it. This may be caused by: A disk in between the bones of the spine bulging out too far (herniated disk). Changes in the spinal disks due to aging. A condition that affects a muscle in the butt. Extra bone growth near the sciatic nerve. A break (fracture) of the area between your hip bones (pelvis). Pregnancy. Tumor. This is rare. What increases the risk? You are more likely to develop this condition if you: Play sports that put pressure or stress on the spine. Have poor strength and ease of movement (flexibility). Have had a back injury or back surgery. Sit for long periods of time. Do activities that involve bending or lifting over and over again. Are very overweight (obese). What are  the signs or symptoms? Symptoms can vary from mild to very bad. They may include: Any of these problems in the lower back, leg, hip, or butt: Mild tingling, loss of feeling, or dull aches. A burning feeling. Sharp pains. Loss of feeling in the back of the calf or the sole of the foot. Leg weakness. Very bad back pain that makes it hard to move. These symptoms may get worse when you cough, sneeze, or laugh. They may also get worse when you sit or stand for long periods of time. How is this treated? This condition often gets better without any treatment. However, treatment may include: Changing or cutting back on physical activity when you have pain. Exercising, including strengthening and stretching. Putting ice or heat on the affected area. Shots of medicines to relieve pain and swelling or to relax your muscles. Surgery. Follow these instructions at home: Medicines Take over-the-counter and prescription medicines only as told by your doctor. Ask your doctor if you should avoid driving or using machines while you are taking your medicine. Managing pain     If told, put ice on the affected area. To do this: Put ice in a plastic bag. Place a towel between your skin and the bag. Leave the ice on for 20 minutes, 2-3 times a day. If your skin turns bright red, take off the ice right away to prevent skin damage. The risk of skin damage is higher if you cannot  feel pain, heat, or cold. If told, put heat on the affected area. Do this as often as told by your doctor. Use the heat source that your doctor tells you to use, such as a moist heat pack or a heating pad. Place a towel between your skin and the heat source. Leave the heat on for 20-30 minutes. If your skin turns bright red, take off the heat right away to prevent burns. The risk of burns is higher if you cannot feel pain, heat, or cold. Activity  Return to your normal activities when your doctor says that it is safe. Avoid  activities that make your symptoms worse. Take short rests during the day. When you rest for a long time, do some physical activity or stretching between periods of rest. Avoid sitting for a long time without moving. Get up and move around at least one time each hour. Do exercises and stretches as told by your doctor. Do not lift anything that is heavier than 10 lb (4.5 kg). Avoid lifting heavy things even when you do not have symptoms. Avoid lifting heavy things over and over. When you lift objects, always lift in a way that is safe for your body. To do this, you should: Bend your knees. Keep the object close to your body. Avoid twisting. General instructions Stay at a healthy weight. Wear comfortable shoes that support your feet. Avoid wearing high heels. Avoid sleeping on a mattress that is too soft or too hard. You might have less pain if you sleep on a mattress that is firm enough to support your back. Contact a doctor if: Your pain is not controlled by medicine. Your pain does not get better. Your pain gets worse. Your pain lasts longer than 4 weeks. You lose weight without trying. Get help right away if: You cannot control when you pee (urinate) or poop (have a bowel movement). You have weakness in any of these areas and it gets worse: Lower back. The area between your hip bones. Butt. Legs. You have redness or swelling of your back. You have a burning feeling when you pee. Summary Sciatica is pain, weakness, tingling, or loss of feeling (numbness) along the sciatic nerve. This may include the lower back, legs, hips, and butt. This condition happens when the sciatic nerve is pinched or has pressure put on it. Treatment often includes rest, exercise, medicines, and putting ice or heat on the affected area. This information is not intended to replace advice given to you by your health care provider. Make sure you discuss any questions you have with your health care  provider. Document Revised: 08/09/2021 Document Reviewed: 08/09/2021 Elsevier Patient Education  2023 [employer].

## 2022-09-27 NOTE — Progress Notes (Signed)
Subjective:  Raymond [name] is a 32 y.o. male who presents for Chief Complaint  Patient presents with   back pain    Back pain- having issues bending over or shifting different angles. Back will tighten up. Needs FMLA forms     Here for f/u on back pain.  I saw him 09/21/22 for new patient for back pain.   Over the last 3-5 days, continuing to use ibupofen, hdyrocodone and flexeril muscle relaxer. He notes maybe 80% improved from last week.  Still feels tight in back.   Still low back pain.  Moving fast causes spasm in back.   Denies pain down legs now compared to what he had last week.  Occaoisnall has numbness or tinging in legs.   No fever, no incontencined, no saddle anestheia, no blood in stool or urine.  From last visit notes from 09/20/09 visit, he had went to urgent care through [facility] for back pain.  Prior to that visit he has been out of town in [place] on vacation.  He was trying to rest.  He had pain and came out of the blue, locking up in his back, tenderness in the back, low back pain on the left radiating down to his buttock and leg.  He did see an urgent care.  They gave him a shot of pain medicine.  Although they sent 2 medicines to the pharmacy, he says that the medications were not available at the pharmacy and he is not sure why.  So his only treatment so far was the Toradol shot at urgent care.  After last visit here on 09/21/22 we intiiated the medicaitons he has already been prescribed as well as additional pain medicaiton for breakthrough pain.  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    He denies any other significant health issues in general. No abdominal pain.  No urinary complaints.  No fever, no blood in the stool or urine.  No numbness down the legs.  No weakness in the legs.  No other aggravating or relieving factors.    No other c/o.  Past Medical History:  Diagnosis Date   Asthma     Hypertension    Current Outpatient Medications on File Prior to Visit  Medication Sig Dispense Refill   cyclobenzaprine (FLEXERIL) 10 MG tablet Take 1 tablet (10 mg total) by mouth 3 (three) times daily as needed for muscle spasms. 15 tablet 0   HYDROcodone-acetaminophen (NORCO) 7.5-325 MG tablet Take 1 tablet by mouth every 6 (six) hours as needed for up to 5 days for moderate pain. 12 tablet 0   IBUPROFEN PO Take by mouth.     meloxicam (MOBIC) 15 MG tablet Take 1 tablet (15 mg total) by mouth daily. 30 tablet 0   No current facility-administered medications on file prior to visit.    The following portions of the patient's history were reviewed and updated as appropriate: allergies, current medications, past family history, past medical history, past social history, past surgical history and problem list.  ROS Otherwise as in subjective above    Objective: BP (!) 120/90   Pulse 84   Wt (!) 312 lb 3.2 oz (141.6 kg)   BMI 42.34 kg/m   General appearance: alert, no distress, well developed, well nourished, muscular build, African-American male Numerous tattoos both arms Tender left lumbar paraspinal region, range of motion about 75% today improved compared to last visit, no  scoliosis or deformity Left hip ROM about 80% of normal and he seems to have pain in left low back with ROM, othewise legs nontender with relatively normal range of motion Abdomen: +bs, soft, non tender, non distended, no masses, no hepatomegaly, no splenomegaly Pulses: 2+ radial pulses, 2+ pedal pulses, normal cap refill Ext: no edema Legs neurovascularly intact    Assessment: Encounter Diagnoses  Name Primary?   Acute left-sided low back pain with left-sided sciatica Yes   Muscle spasm    Left hip pain       Plan: Improving compared to last visit but not quite to goal.     Recommendations:  referral today for xrays and physical therapy.  Expect a phone call for physical therapy within the  next 2-3 days  I have written you out of work today through 10/02/2022.   I have you going back to work 10/03/22 light duty through 10/13/22.  I will need to see you back 10/13/22 for follow up.  Continue Meloxicam daily for the next 1-2 weeks  You can continue flexeril muscle relaxer 1-2 times daily as needed for spasm in low back  You can still use the Hydrocodone pain medication for worse breakthrough pain  Stretch daily    Please go to [facility] for your back and left hip xrays.   Their hours are 8am - 4:30 pm Monday - Friday.  Take your insurance card with you.  [facility] [phone]   [address] [place], [place] [address]    Note given for work.  [name] was seen today for back pain.  Diagnoses and all orders for this visit:  Acute left-sided low back pain with left-sided sciatica -     DG Lumbar Spine Complete; Future -     DG Hip Unilat W OR W/O Pelvis 2-3 Views Left; Future  Muscle spasm -     DG Lumbar Spine Complete; Future -     DG Hip Unilat W OR W/O Pelvis 2-3 Views Left; Future  Left hip pain -     DG Lumbar Spine Complete; Future -     DG Hip Unilat W OR W/O Pelvis 2-3 Views Left; Future    Follow up: 2-3 weeks

## 2022-09-28 ENCOUNTER — Ambulatory Visit
Admission: RE | Admit: 2022-09-28 | Discharge: 2022-09-28 | Disposition: A | Discharge status: Home / Self Care | Payer: Self-pay | Source: Ambulatory Visit | Attending: Medical | Admitting: Medical

## 2022-09-28 DIAGNOSIS — M5459 Other low back pain: Secondary | ICD-10-CM | POA: Diagnosis not present

## 2022-09-28 DIAGNOSIS — M5442 Lumbago with sciatica, left side: Secondary | ICD-10-CM

## 2022-09-28 DIAGNOSIS — M62838 Other muscle spasm: Secondary | ICD-10-CM

## 2022-09-28 DIAGNOSIS — M5432 Sciatica, left side: Secondary | ICD-10-CM | POA: Diagnosis not present

## 2022-09-28 DIAGNOSIS — M25552 Pain in left hip: Secondary | ICD-10-CM

## 2022-09-29 ENCOUNTER — Ambulatory Visit: Payer: Self-pay | Admitting: Medical

## 2022-10-02 NOTE — Progress Notes (Signed)
Results sent through MyChart

## 2022-10-03 ENCOUNTER — Encounter: Payer: Self-pay | Admitting: Medical

## 2022-10-05 NOTE — Telephone Encounter (Signed)
Forms were placed on my desk by someone. I have notified pt that they have been filled out and sending forms to front office to fax back to patient job as I do not know if that was done or not already

## 2022-10-05 NOTE — Telephone Encounter (Signed)
I do not have these. [id number] please check up front

## 2022-10-12 ENCOUNTER — Ambulatory Visit (INDEPENDENT_AMBULATORY_CARE_PROVIDER_SITE_OTHER): Payer: BC Managed Care – PPO | Admitting: Medical

## 2022-10-12 VITALS — BP 130/80 | HR 70 | Wt 312.4 lb

## 2022-10-12 DIAGNOSIS — M545 Low back pain, unspecified: Secondary | ICD-10-CM | POA: Diagnosis not present

## 2022-10-12 DIAGNOSIS — M62838 Other muscle spasm: Secondary | ICD-10-CM | POA: Diagnosis not present

## 2022-10-12 DIAGNOSIS — M25552 Pain in left hip: Secondary | ICD-10-CM

## 2022-10-12 NOTE — Progress Notes (Signed)
Subjective:  Raymond [name] is a 32 y.o. male who presents for Chief Complaint  Patient presents with   work clearance    Work clearance- needs to return to work. Minor pain. Working on stretches and exericses     Here for f/u on back pain.   Prior visits include 09/21/2022, 09/27/2022.  Last visit we referred him to physical therapy.  He is improving.  He feels probably 90% or better improved.   Did 1 consult with PT which was very helpful.  He is doing the home exercising demonstrated by PT.  PT was with [facility].    From last visit notes from 09/20/09 visit, he had went to urgent care through [facility] for back pain.  Prior to that visit he has been out of town in [place] on vacation.  He was trying to rest.  He had pain and came out of the blue, locking up in his back, tenderness in the back, low back pain on the left radiating down to his buttock and leg.  He did see an urgent care.  They gave him a shot of pain medicine.  Although they sent 2 medicines to the pharmacy, he says that the medications were not available at the pharmacy and he is not sure why.  So his only treatment so far was the Toradol shot at urgent care.  After last visit here on 09/21/22 we initiated the medicaiton he has already been prescribed as well as additional pain medicaiton for breakthrough pain.  He works in a [employer].  He is handling material, lifting heavy objects, driving a forklift.    He denies any other significant health issues in general. No abdominal pain.  No urinary complaints.  No fever, no blood in the stool or urine.  No numbness down the legs.  No weakness in the legs.  No other aggravating or relieving factors.    No other c/o.  Past Medical History:  Diagnosis Date   Asthma    Hypertension    Current Outpatient Medications on File Prior to Visit  Medication Sig Dispense Refill   cyclobenzaprine (FLEXERIL) 10 MG tablet Take 1 tablet (10 mg total) by mouth 3 (three)  times daily as needed for muscle spasms. 15 tablet 0   IBUPROFEN PO Take by mouth.     meloxicam (MOBIC) 15 MG tablet Take 1 tablet (15 mg total) by mouth daily. (Patient not taking: Reported on 10/12/2022) 30 tablet 0   No current facility-administered medications on file prior to visit.    The following portions of the patient's history were reviewed and updated as appropriate: allergies, current medications, past family history, past medical history, past social history, past surgical history and problem list.  ROS Otherwise as in subjective above    Objective: BP 130/80   Pulse 70   Wt (!) 312 lb 6.4 oz (141.7 kg)   BMI 42.37 kg/m   General appearance: alert, no distress, well developed, well nourished, muscular build Today MSK legs and arms nontender with normal ROM Back nontender with normal rom Pulses: 2+ radial pulses, 2+ pedal pulses, normal cap refill Ext: no edema Legs neurovascularly intact    Assessment: Encounter Diagnoses  Name Primary?   Low back pain, unspecified back pain laterality, unspecified chronicity, unspecified whether sciatica present Yes   Left hip pain    Muscle spasm        Plan: He is much improved.  He only completed one PT session though.  He seems ready to resume back to work.   Return to work Friday as planned  Continue muscle relaxer flexeril prn, NSAID occasionally prn.  Continue with home stretching regimen demonstrated by PT  Discuss proper lifting and avoiding reinjury.   Jahrell was seen today for work clearance.  Diagnoses and all orders for this visit:  Low back pain, unspecified back pain laterality, unspecified chronicity, unspecified whether sciatica present  Left hip pain  Muscle spasm     Follow up: prn

## 2022-10-27 ENCOUNTER — Other Ambulatory Visit: Payer: Self-pay | Admitting: Medical

## 2022-10-27 DIAGNOSIS — M62838 Other muscle spasm: Secondary | ICD-10-CM

## 2022-10-27 DIAGNOSIS — M5432 Sciatica, left side: Secondary | ICD-10-CM

## 2022-10-27 DIAGNOSIS — T148XXA Other injury of unspecified body region, initial encounter: Secondary | ICD-10-CM

## 2022-10-27 DIAGNOSIS — M545 Low back pain, unspecified: Secondary | ICD-10-CM

## 2023-10-10 ENCOUNTER — Encounter: Payer: Self-pay | Admitting: Medical

## 2023-10-10 ENCOUNTER — Ambulatory Visit (INDEPENDENT_AMBULATORY_CARE_PROVIDER_SITE_OTHER): Payer: Self-pay | Admitting: Medical

## 2023-10-10 VITALS — BP 132/84 | HR 67 | Temp 97.3°F | Ht 72.0 in | Wt 327.8 lb

## 2023-10-10 DIAGNOSIS — I10 Essential (primary) hypertension: Secondary | ICD-10-CM

## 2023-10-10 DIAGNOSIS — R519 Headache, unspecified: Secondary | ICD-10-CM

## 2023-10-10 NOTE — Patient Instructions (Signed)
 Recommendations: Begin Amlodipine 5mg  daily in the morning.   This is already at your pharmacy Work on losing weight over then next few months Try to exercise 4-5 days per week with aerobic exercise Work on diet changes.   Eat fruits, eat vegetables, but limit or avoid salty foods, processed foods, heavy meat portions Limit caffeine Avoid alcohol Monitor blood pressure at home Goal is 120/70 High blood pressure is anything over 140/90 Borderline is 130/80 If your pressure is too low under 108/60 or less, you may feel dizzy or lightheaded which would not be safe So monitor your pressures over the next few weeks to make sure you are in the normal range, and not too low or too high Plan follow up in 1 month
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
## 2023-10-16 ENCOUNTER — Ambulatory Visit: Admitting: Medical
